# Patient Record
Sex: Female | Born: 1960 | Race: Black or African American | Hispanic: No | Smoking: Current every day smoker
Health system: Southern US, Community
[De-identification: ages and names within clinical notes are randomized; demographics above are authoritative.]

## PROBLEM LIST (undated history)

## (undated) DIAGNOSIS — F319 Bipolar disorder, unspecified: Secondary | ICD-10-CM

## (undated) DIAGNOSIS — J449 Chronic obstructive pulmonary disease, unspecified: Secondary | ICD-10-CM

## (undated) DIAGNOSIS — E119 Type 2 diabetes mellitus without complications: Secondary | ICD-10-CM

## (undated) HISTORY — PX: OVARIAN CYST SURGERY: SHX726

---

## 1999-11-23 ENCOUNTER — Emergency Department (HOSPITAL_COMMUNITY): Admission: EM | Admit: 1999-11-23 | Discharge: 1999-11-23 | Payer: Self-pay | Admitting: Emergency Medicine

## 2000-01-23 ENCOUNTER — Emergency Department (HOSPITAL_COMMUNITY): Admission: EM | Admit: 2000-01-23 | Discharge: 2000-01-24 | Payer: Self-pay | Admitting: Emergency Medicine

## 2009-09-27 ENCOUNTER — Emergency Department (HOSPITAL_COMMUNITY): Admission: EM | Admit: 2009-09-27 | Discharge: 2009-09-27 | Payer: Self-pay | Admitting: Emergency Medicine
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
## 2018-06-20 ENCOUNTER — Emergency Department (HOSPITAL_BASED_OUTPATIENT_CLINIC_OR_DEPARTMENT_OTHER)
Admission: EM | Admit: 2018-06-20 | Discharge: 2018-06-20 | Disposition: A | Discharge status: Home / Self Care | Payer: Medicare HMO | Attending: Emergency Medicine | Admitting: Emergency Medicine

## 2018-06-20 ENCOUNTER — Other Ambulatory Visit: Payer: Self-pay

## 2018-06-20 ENCOUNTER — Encounter (HOSPITAL_BASED_OUTPATIENT_CLINIC_OR_DEPARTMENT_OTHER): Payer: Self-pay | Admitting: Emergency Medicine

## 2018-06-20 DIAGNOSIS — W1840XA Slipping, tripping and stumbling without falling, unspecified, initial encounter: Secondary | ICD-10-CM | POA: Diagnosis not present

## 2018-06-20 DIAGNOSIS — Y939 Activity, unspecified: Secondary | ICD-10-CM | POA: Insufficient documentation

## 2018-06-20 DIAGNOSIS — Y929 Unspecified place or not applicable: Secondary | ICD-10-CM | POA: Diagnosis not present

## 2018-06-20 DIAGNOSIS — E119 Type 2 diabetes mellitus without complications: Secondary | ICD-10-CM | POA: Diagnosis not present

## 2018-06-20 DIAGNOSIS — F17210 Nicotine dependence, cigarettes, uncomplicated: Secondary | ICD-10-CM | POA: Insufficient documentation

## 2018-06-20 DIAGNOSIS — M79605 Pain in left leg: Secondary | ICD-10-CM | POA: Diagnosis present

## 2018-06-20 DIAGNOSIS — M5432 Sciatica, left side: Secondary | ICD-10-CM | POA: Insufficient documentation

## 2018-06-20 DIAGNOSIS — J449 Chronic obstructive pulmonary disease, unspecified: Secondary | ICD-10-CM | POA: Diagnosis not present

## 2018-06-20 DIAGNOSIS — Y999 Unspecified external cause status: Secondary | ICD-10-CM | POA: Diagnosis not present

## 2018-06-20 HISTORY — DX: Chronic obstructive pulmonary disease, unspecified: J44.9

## 2018-06-20 HISTORY — DX: Bipolar disorder, unspecified: F31.9

## 2018-06-20 HISTORY — DX: Type 2 diabetes mellitus without complications: E11.9

## 2018-06-20 MED ORDER — PREDNISONE 20 MG PO TABS: ORAL_TABLET | ORAL | 0 refills | Status: DC

## 2018-06-20 MED ORDER — LIDOCAINE 5 % EX PTCH: 1.0000 | MEDICATED_PATCH | CUTANEOUS | 0 refills | Status: AC

## 2018-06-20 MED ORDER — KETOROLAC TROMETHAMINE 60 MG/2ML IM SOLN
30.0000 mg | Freq: Once | INTRAMUSCULAR | Status: AC
  Administered 2018-06-20: 03:00:00 30 mg via INTRAMUSCULAR
  Filled 2018-06-20: qty 2

## 2018-06-20 MED ORDER — DEXAMETHASONE SODIUM PHOSPHATE 10 MG/ML IJ SOLN
10.0000 mg | Freq: Once | INTRAMUSCULAR | Status: AC
  Administered 2018-06-20: 03:00:00 10 mg via INTRAMUSCULAR
  Filled 2018-06-20: qty 1

## 2018-06-20 MED ORDER — DICLOFENAC SODIUM 1 % TD GEL: 4.0000 g | Freq: Four times a day (QID) | TRANSDERMAL | 0 refills | Status: DC

## 2018-06-20 MED ORDER — PREDNISONE 20 MG PO TABS: ORAL_TABLET | ORAL | 0 refills | Status: AC

## 2018-06-20 MED ORDER — LIDOCAINE 5 % EX PTCH: 1.0000 | MEDICATED_PATCH | CUTANEOUS | 0 refills | Status: DC

## 2018-06-20 MED ORDER — DICLOFENAC SODIUM 1 % TD GEL: 4.0000 g | Freq: Four times a day (QID) | TRANSDERMAL | 0 refills | Status: AC

## 2018-06-20 NOTE — ED Notes (Signed)
Pt provided with 2 heat packs to use at home. D/C paperwork understood.
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
## 2018-06-20 NOTE — ED Triage Notes (Signed)
Pt reports left leg pain since 'slipping' this afternoon. Denies falling. Pain radiates down her left buttock into her posterior leg.

## 2018-08-08 DEATH — deceased
